# Patient Record
Sex: Male | Born: 1980 | Race: Black or African American | Hispanic: No | Marital: Single | Smoking: Current every day smoker
Health system: Southern US, Community
[De-identification: ages and names within clinical notes are randomized; demographics above are authoritative.]

## PROBLEM LIST (undated history)

## (undated) DIAGNOSIS — J45909 Unspecified asthma, uncomplicated: Secondary | ICD-10-CM

---

## 2010-11-17 LAB — STREP THROAT SCREEN: Strep Screen: POSITIVE

## 2011-07-16 NOTE — ED Notes (Signed)
Patient did not respond to calls to triage by triage nurse.

## 2011-07-17 MED ORDER — CYCLOBENZAPRINE 10 MG TAB
10 mg | ORAL_TABLET | Freq: Three times a day (TID) | ORAL | Status: DC | PRN
Start: 2011-07-17 — End: 2011-09-12

## 2011-07-17 MED ORDER — CYCLOBENZAPRINE 10 MG TAB
10 mg | ORAL | Status: AC
Start: 2011-07-17 — End: 2011-07-17
  Administered 2011-07-17: 08:00:00 via ORAL

## 2011-07-17 MED ORDER — NAPROXEN 250 MG TAB
250 mg | ORAL | Status: AC
Start: 2011-07-17 — End: 2011-07-17
  Administered 2011-07-17: 08:00:00 via ORAL

## 2011-07-17 MED ORDER — HYDROCODONE-ACETAMINOPHEN 5 MG-325 MG TAB
5-325 mg | ORAL_TABLET | ORAL | Status: DC
Start: 2011-07-17 — End: 2011-09-12

## 2011-07-17 MED ORDER — NAPROXEN 500 MG TAB
500 mg | ORAL_TABLET | Freq: Two times a day (BID) | ORAL | Status: AC
Start: 2011-07-17 — End: 2011-07-27

## 2011-07-17 MED ORDER — NAPROXEN 250 MG TAB
250 mg | Freq: Two times a day (BID) | ORAL | Status: DC
Start: 2011-07-17 — End: 2011-07-17

## 2011-07-17 MED ORDER — HYDROMORPHONE (PF) 1 MG/ML IJ SOLN
1 mg/mL | Freq: Once | INTRAMUSCULAR | Status: AC
Start: 2011-07-17 — End: 2011-07-17
  Administered 2011-07-17: 08:00:00 via INTRAMUSCULAR

## 2011-07-17 MED FILL — NAPROXEN 250 MG TAB: 250 mg | ORAL | Qty: 2

## 2011-07-17 MED FILL — HYDROMORPHONE (PF) 1 MG/ML IJ SOLN: 1 mg/mL | INTRAMUSCULAR | Qty: 1

## 2011-07-17 MED FILL — CYCLOBENZAPRINE 10 MG TAB: 10 mg | ORAL | Qty: 1

## 2011-07-17 NOTE — ED Notes (Signed)
I have reviewed discharge instructions with the patient.  The patient verbalized understanding.  Patient armband removed and shredded

## 2011-07-17 NOTE — ED Notes (Signed)
Pt c/o left flank pain and back spasms  X 2 days now radiating towards midline. Pain worsens with twisting.
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
## 2011-09-12 MED ORDER — PREDNISONE 20 MG TAB
20 mg | ORAL | Status: AC
Start: 2011-09-12 — End: 2011-09-12
  Administered 2011-09-12: 14:00:00 via ORAL

## 2011-09-12 MED ORDER — OXYCODONE-ACETAMINOPHEN 5 MG-325 MG TAB
5-325 mg | ORAL_TABLET | ORAL | Status: DC | PRN
Start: 2011-09-12 — End: 2013-03-08

## 2011-09-12 MED ORDER — PREDNISONE 10 MG TABLETS IN A DOSE PACK
10 mg | ORAL_TABLET | ORAL | Status: DC
Start: 2011-09-12 — End: 2013-03-08

## 2011-09-12 MED FILL — PREDNISONE 20 MG TAB: 20 mg | ORAL | Qty: 3
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
## 2011-09-12 NOTE — ED Notes (Signed)
Chase Abbott is a 31 y.o. male that was discharged in good condition.  The patients diagnosis, condition and treatment were explained to  patient and aftercare instructions were given.  The patient verbalized understanding. Patient armband removed and shredded.

## 2011-09-12 NOTE — ED Notes (Signed)
C/O low back pain that radiates down buttocks to bilateral legs x 3 weeks. Pt states that he was seen here last month for same.
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
## 2013-03-08 MED ADMIN — diph,Pertuss(AC),Tet Vac-PF (BOOSTRIX) suspension 0.5 mL: INTRAMUSCULAR | @ 20:00:00 | NDC 58160084201

## 2013-03-08 MED ADMIN — HYDROcodone-acetaminophen (NORCO) 5-325 mg per tablet 1 tablet: ORAL | @ 20:00:00 | NDC 51079077701

## 2013-03-08 MED ADMIN — cyclobenzaprine (FLEXERIL) tablet 10 mg: ORAL | @ 20:00:00 | NDC 68084039711

## 2013-03-08 MED ADMIN — ibuprofen (MOTRIN) tablet 800 mg: ORAL | @ 20:00:00 | NDC 00904585361
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
## 2013-03-08 NOTE — ED Provider Notes (Signed)
I was personally available for consultation in the emergency department.  I have reviewed the chart prior to the patient's discharge and agree with the documentation recorded by the MLP, including the assessment, treatment plan, and disposition.  Saige Canton, MD

## 2014-08-10 ENCOUNTER — Emergency Department: Admit: 2014-08-10 | Payer: Self-pay

## 2014-08-10 ENCOUNTER — Inpatient Hospital Stay
Admit: 2014-08-10 | Discharge: 2014-08-10 | Disposition: A | Discharge status: Home / Self Care | Payer: Self-pay | Attending: Emergency Medicine

## 2014-08-10 DIAGNOSIS — J45901 Unspecified asthma with (acute) exacerbation: Secondary | ICD-10-CM

## 2014-08-10 MED ORDER — ALUM-MAG HYDROXIDE-SIMETH 200 MG-200 MG-20 MG/5 ML ORAL SUSP
200-200-20 mg/5 mL | ORAL | Status: DC | PRN
Start: 2014-08-10 — End: 2016-07-14

## 2014-08-10 MED ORDER — IPRATROPIUM-ALBUTEROL 2.5 MG-0.5 MG/3 ML NEB SOLUTION
2.5 mg-0.5 mg/3 ml | RESPIRATORY_TRACT | Status: AC
Start: 2014-08-10 — End: 2014-08-10
  Administered 2014-08-10: 22:00:00 via RESPIRATORY_TRACT

## 2014-08-10 MED ORDER — ALBUTEROL SULFATE HFA 90 MCG/ACTUATION AEROSOL INHALER
90 mcg/actuation | RESPIRATORY_TRACT | Status: AC | PRN
Start: 2014-08-10 — End: ?

## 2014-08-10 MED ORDER — METHYLPREDNISOLONE 4 MG TABS IN A DOSE PACK
4 mg | ORAL | Status: DC
Start: 2014-08-10 — End: 2016-07-14

## 2014-08-10 MED ORDER — OMEPRAZOLE 10 MG CAP, DELAYED RELEASE
10 mg | ORAL_CAPSULE | Freq: Every day | ORAL | Status: AC
Start: 2014-08-10 — End: 2014-08-30

## 2014-08-10 MED ORDER — DEXAMETHASONE SODIUM PHOSPHATE 4 MG/ML IJ SOLN
4 mg/mL | INTRAMUSCULAR | Status: AC
Start: 2014-08-10 — End: 2014-08-10
  Administered 2014-08-10: 22:00:00 via ORAL

## 2014-08-10 MED ORDER — ALBUTEROL SULFATE 0.083 % (0.83 MG/ML) SOLN FOR INHALATION
2.5 mg /3 mL (0.083 %) | RESPIRATORY_TRACT | Status: DC
Start: 2014-08-10 — End: 2014-08-10

## 2014-08-10 MED ORDER — ALUMINUM-MAGNESIUM HYDROXIDE 200 MG-200 MG/5 ML ORAL SUSP
200-200 mg/5 mL | Freq: Once | ORAL | Status: AC
Start: 2014-08-10 — End: 2014-08-10
  Administered 2014-08-10: 23:00:00 via ORAL

## 2014-08-10 MED FILL — DEXAMETHASONE SODIUM PHOSPHATE 4 MG/ML IJ SOLN: 4 mg/mL | INTRAMUSCULAR | Qty: 3

## 2014-08-10 MED FILL — ALBUTEROL SULFATE 0.083 % (0.83 MG/ML) SOLN FOR INHALATION: 2.5 mg /3 mL (0.083 %) | RESPIRATORY_TRACT | Qty: 1

## 2014-08-10 MED FILL — PHENOBARB-HYOSCYAMN-ATROPINE-SCOP 16.2 MG-0.1037 MG/5 ML (5 ML) ELIXIR: 16.2 mg-0.1037 mg/5 mL (5 mL) | ORAL | Qty: 10

## 2014-08-10 MED FILL — IPRATROPIUM-ALBUTEROL 2.5 MG-0.5 MG/3 ML NEB SOLUTION: 2.5 mg-0.5 mg/3 ml | RESPIRATORY_TRACT | Qty: 3

## 2014-08-10 NOTE — ED Notes (Signed)
Pt reports having exacerbation of his asthma.  States ran out of MDI and used HHN treatment yesterday.  Pt also reports "burning when I eat or drink" since Friday.  Pt in NAD.
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
## 2014-08-10 NOTE — ED Notes (Signed)
I have reviewed discharge instructions with the patient. Prescriptions x 4 were reviewed with patient instructed not to drink alcohol, drive a car, or operate heavy machinery while taking this medicine. The patient verbalized understanding. Patient seen leaving ED ambulatory without difficulty or need for assistance, with s/o in no sign of distress. Patient armband removed and shredded

## 2014-08-11 LAB — EKG, 12 LEAD, INITIAL
Atrial Rate: 92 {beats}/min
Calculated P Axis: 49 degrees
Calculated R Axis: 39 degrees
Calculated T Axis: 12 degrees
Diagnosis: NORMAL
P-R Interval: 152 ms
Q-T Interval: 336 ms
QRS Duration: 86 ms
QTC Calculation (Bezet): 415 ms
Ventricular Rate: 92 {beats}/min

## 2016-07-14 ENCOUNTER — Inpatient Hospital Stay
Admit: 2016-07-14 | Discharge: 2016-07-14 | Disposition: A | Discharge status: Home / Self Care | Payer: Self-pay | Attending: Emergency Medicine

## 2016-07-14 ENCOUNTER — Emergency Department: Admit: 2016-07-14 | Payer: Self-pay

## 2016-07-14 DIAGNOSIS — S0003XA Contusion of scalp, initial encounter: Secondary | ICD-10-CM

## 2016-07-14 MED ORDER — NAPROXEN 375 MG TAB
375 mg | ORAL_TABLET | Freq: Two times a day (BID) | ORAL | 0 refills | Status: AC
Start: 2016-07-14 — End: ?

## 2016-07-14 NOTE — ED Notes (Signed)
I have reviewed discharge instructions with the patient.  The patient verbalized understanding.  Current Discharge Medication List      START taking these medications    Details   naproxen (NAPROSYN) 375 mg tablet Take 1 Tab by mouth two (2) times daily (with meals).  Qty: 20 Tab, Refills: 0         Patient armband removed and shredded
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
## 2017-03-21 ENCOUNTER — Encounter (HOSPITAL_COMMUNITY): Payer: Self-pay

## 2017-03-21 ENCOUNTER — Emergency Department (HOSPITAL_COMMUNITY)
Admission: EM | Admit: 2017-03-21 | Discharge: 2017-03-21 | Disposition: A | Discharge status: Home / Self Care | Payer: Self-pay | Attending: Emergency Medicine | Admitting: Emergency Medicine

## 2017-03-21 DIAGNOSIS — Y999 Unspecified external cause status: Secondary | ICD-10-CM | POA: Insufficient documentation

## 2017-03-21 DIAGNOSIS — W268XXA Contact with other sharp object(s), not elsewhere classified, initial encounter: Secondary | ICD-10-CM | POA: Insufficient documentation

## 2017-03-21 DIAGNOSIS — S61214A Laceration without foreign body of right ring finger without damage to nail, initial encounter: Secondary | ICD-10-CM | POA: Insufficient documentation

## 2017-03-21 DIAGNOSIS — S61216A Laceration without foreign body of right little finger without damage to nail, initial encounter: Secondary | ICD-10-CM | POA: Insufficient documentation

## 2017-03-21 DIAGNOSIS — Y939 Activity, unspecified: Secondary | ICD-10-CM | POA: Insufficient documentation

## 2017-03-21 DIAGNOSIS — Y92000 Kitchen of unspecified non-institutional (private) residence as  the place of occurrence of the external cause: Secondary | ICD-10-CM | POA: Insufficient documentation

## 2017-03-21 DIAGNOSIS — J45909 Unspecified asthma, uncomplicated: Secondary | ICD-10-CM | POA: Insufficient documentation

## 2017-03-21 DIAGNOSIS — F17200 Nicotine dependence, unspecified, uncomplicated: Secondary | ICD-10-CM | POA: Insufficient documentation

## 2017-03-21 HISTORY — DX: Unspecified asthma, uncomplicated: J45.909

## 2017-03-21 MED ORDER — TETANUS-DIPHTH-ACELL PERTUSSIS 5-2.5-18.5 LF-MCG/0.5 IM SUSP: 0.5000 mL | Freq: Once | INTRAMUSCULAR | Status: DC

## 2017-03-21 NOTE — ED Provider Notes (Signed)
   [facility]EM[facility] DEPT Provider Note: [name], MD, FACEP  CSN: [id number] MRN: [id number] ARRIVAL: 03/21/17 at 0440 ROOM: Triage   CHIEF COMPLAINT  Extremity Laceration   HISTORY OF PRESENT ILLNESS  03/21/17 4:53 AM Stephen Sutton is a 36 y.o. male who cut the dorsal PIP joints of the fourth and fifth fingers of his right hand on the bezel of his stove yesterday evening about 9 PM while cooking.  He applied bandages at home with a continued oozing blood so he came to the ED this morning.  They are now hemostatic.  There is minimal associated pain.  There is no functional deficit or sensory deficit.  He is not sure of his tetanus status.   Past Medical History:  Diagnosis Date  . Asthma     History reviewed. No pertinent surgical history.  History reviewed. No pertinent family history.  Social History   Tobacco Use  . Smoking status: Current Every Day Smoker  . Smokeless tobacco: Never Used  Substance Use Topics  . Alcohol use: Yes  . Drug use: No    Prior to Admission medications   Not on File    Allergies Patient has no known allergies.   REVIEW OF SYSTEMS  Negative except as noted here or in the History of Present Illness.   PHYSICAL EXAMINATION  Initial Vital Signs Blood pressure 121/74, pulse (!) 102, temperature 98.3 F (36.8 C), temperature source Oral, resp. rate 18, height 6\' 2"  (1.88 m), weight 115.7 kg (255 lb), SpO2 96 %.  Examination General: Well-developed, well-nourished male in no acute distress; appearance consistent with age of record HENT: normocephalic; atraumatic Eyes: Normal appearance Neck: supple Heart: regular rate and rhythm Lungs: clear to auscultation bilaterally Abdomen: soft; nondistended present Extremities: No deformity; full range of motion Neurologic: Awake, alert; motor function intact in all extremities and symmetric; no facial droop Skin: Warm and dry; superficial lacerations over the right dorsal fourth and fifth  PIP joints, not full-thickness Psychiatric: Normal mood and affect   RESULTS  Summary of this visit's results, reviewed by myself:   EKG Interpretation  Date/Time:    Ventricular Rate:    PR Interval:    QRS Duration:   QT Interval:    QTC Calculation:   R Axis:     Text Interpretation:        Laboratory Studies: No results found for this or any previous visit (from the past 24 hour(s)). Imaging Studies: No results found.  ED COURSE  Nursing notes and initial vitals signs, including pulse oximetry, reviewed.  Vitals:   03/21/17 0449 03/21/17 0452  BP: 121/74   Pulse: (!) 102   Resp: 18   Temp: 98.3 F (36.8 C)   TempSrc: Oral   SpO2: 96%   Weight:  115.7 kg (255 lb)  Height:  6\' 2"  (1.88 m)   Primary closure of wounds not indicated due to their shallow depth as well as extended time since the injury.  We will apply local wound care.  PROCEDURES    ED DIAGNOSES     ICD-10-CM   1. Laceration of right ring finger without foreign body without damage to nail, initial encounter S61.214A   2. Laceration of right little finger without foreign body without damage to nail, initial encounter [id number]        Paula LibraMolpus, Ardyn Forge, MD 03/21/17 [phone]
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
## 2017-03-21 NOTE — ED Notes (Signed)
Bed: [id number] Expected date:  Expected time:  Means of arrival:  Comments: Triage [place]

## 2017-06-04 ENCOUNTER — Ambulatory Visit: Payer: Self-pay | Admitting: Family Medicine

## 2020-03-10 ENCOUNTER — Emergency Department (HOSPITAL_COMMUNITY): Payer: 59

## 2020-03-10 ENCOUNTER — Other Ambulatory Visit: Payer: Self-pay

## 2020-03-10 ENCOUNTER — Encounter (HOSPITAL_COMMUNITY): Payer: Self-pay

## 2020-03-10 ENCOUNTER — Emergency Department (HOSPITAL_COMMUNITY)
Admission: EM | Admit: 2020-03-10 | Discharge: 2020-03-11 | Disposition: A | Discharge status: Home / Self Care | Payer: 59 | Attending: Emergency Medicine | Admitting: Emergency Medicine

## 2020-03-10 DIAGNOSIS — J45909 Unspecified asthma, uncomplicated: Secondary | ICD-10-CM | POA: Insufficient documentation

## 2020-03-10 DIAGNOSIS — Z5321 Procedure and treatment not carried out due to patient leaving prior to being seen by health care provider: Secondary | ICD-10-CM | POA: Insufficient documentation

## 2020-03-10 DIAGNOSIS — H5789 Other specified disorders of eye and adnexa: Secondary | ICD-10-CM | POA: Diagnosis not present
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
## 2020-03-11 NOTE — ED Notes (Signed)
Pt eloped from waiting area. Called 3X.  

## 2020-03-11 NOTE — ED Notes (Signed)
Pt called no answer 

## 2020-03-15 ENCOUNTER — Emergency Department (HOSPITAL_BASED_OUTPATIENT_CLINIC_OR_DEPARTMENT_OTHER)
Admission: EM | Admit: 2020-03-15 | Discharge: 2020-03-16 | Disposition: A | Discharge status: Home / Self Care | Payer: 59 | Attending: Emergency Medicine | Admitting: Emergency Medicine

## 2020-03-15 ENCOUNTER — Emergency Department (HOSPITAL_BASED_OUTPATIENT_CLINIC_OR_DEPARTMENT_OTHER): Payer: 59

## 2020-03-15 ENCOUNTER — Encounter (HOSPITAL_BASED_OUTPATIENT_CLINIC_OR_DEPARTMENT_OTHER): Payer: Self-pay | Admitting: *Deleted

## 2020-03-15 ENCOUNTER — Other Ambulatory Visit: Payer: Self-pay

## 2020-03-15 DIAGNOSIS — J4521 Mild intermittent asthma with (acute) exacerbation: Secondary | ICD-10-CM | POA: Insufficient documentation

## 2020-03-15 DIAGNOSIS — J069 Acute upper respiratory infection, unspecified: Secondary | ICD-10-CM | POA: Diagnosis not present

## 2020-03-15 DIAGNOSIS — Z20822 Contact with and (suspected) exposure to covid-19: Secondary | ICD-10-CM | POA: Diagnosis not present

## 2020-03-15 DIAGNOSIS — R059 Cough, unspecified: Secondary | ICD-10-CM | POA: Diagnosis present

## 2020-03-15 DIAGNOSIS — F17200 Nicotine dependence, unspecified, uncomplicated: Secondary | ICD-10-CM | POA: Diagnosis not present

## 2020-03-15 LAB — RESPIRATORY PANEL BY RT PCR (FLU A&B, COVID)
Influenza A by PCR: NEGATIVE
Influenza B by PCR: NEGATIVE
SARS Coronavirus 2 by RT PCR: NEGATIVE

## 2020-03-15 MED ORDER — IPRATROPIUM BROMIDE HFA 17 MCG/ACT IN AERS
4.0000 | INHALATION_SPRAY | Freq: Once | RESPIRATORY_TRACT | Status: AC
  Administered 2020-03-15: 4 via RESPIRATORY_TRACT
  Filled 2020-03-15: qty 12.9

## 2020-03-15 MED ORDER — ALBUTEROL SULFATE HFA 108 (90 BASE) MCG/ACT IN AERS
8.0000 | INHALATION_SPRAY | RESPIRATORY_TRACT | Status: DC | PRN
  Administered 2020-03-15: 8 via RESPIRATORY_TRACT
  Filled 2020-03-15: qty 6.7

## 2020-03-15 MED ORDER — ALBUTEROL (5 MG/ML) CONTINUOUS INHALATION SOLN
10.0000 mg/h | INHALATION_SOLUTION | RESPIRATORY_TRACT | Status: AC
  Administered 2020-03-15: 10 mg/h via RESPIRATORY_TRACT
  Filled 2020-03-15: qty 20

## 2020-03-15 MED ORDER — PREDNISONE 10 MG PO TABS
60.0000 mg | ORAL_TABLET | Freq: Once | ORAL | Status: AC
  Administered 2020-03-15: 60 mg via ORAL
  Filled 2020-03-15: qty 1

## 2020-03-15 NOTE — ED Provider Notes (Signed)
[facility] EMERGENCY DEPARTMENT Provider Note   CSN: [id number] Arrival date & time: 03/15/20  2217     History Chief Complaint  Patient presents with  . Asthma    Stephen Sutton is a 39 y.o. male.  Patient reports a previous history of asthma.  He is here in the emergency department tonight because he has had 5 days of cough, nasal congestion, sore throat with shortness of breath.  He has tried over-the-counter medications but does not currently have an inhaler or nebulizer.  Tonight his breathing worsened.        Past Medical History:  Diagnosis Date  . Asthma     There are no problems to display for this patient.   History reviewed. No pertinent surgical history.     No family history on file.  Social History   Tobacco Use  . Smoking status: Current Every Day Smoker  . Smokeless tobacco: Never Used  Substance Use Topics  . Alcohol use: Yes  . Drug use: No    Home Medications Prior to Admission medications   Medication Sig Start Date End Date Taking? Authorizing Provider  albuterol (VENTOLIN HFA) 108 (90 Base) MCG/ACT inhaler Inhale 2 puffs into the lungs every 4 (four) hours as needed for wheezing or shortness of breath. 03/16/20   Gilda Crease, MD  benzonatate (TESSALON) 200 MG capsule Take 1 capsule (200 mg total) by mouth 3 (three) times daily as needed for cough. 03/16/20   Gilda Crease, MD  Dextromethorphan-guaiFENesin (MUCINEX DM MAXIMUM STRENGTH) 60-1200 MG TB12 Take 1 tablet by mouth in the morning and at bedtime. 03/16/20   Gilda Crease, MD  predniSONE (DELTASONE) 20 MG tablet Take 2 tablets (40 mg total) by mouth daily with breakfast. 03/16/20   Findley Vi, Canary Brim, MD    Allergies    Patient has no known allergies.  Review of Systems   Review of Systems  HENT: Positive for congestion and sore throat.   Respiratory: Positive for cough, shortness of breath and wheezing.   All other systems reviewed  and are negative.   Physical Exam Updated Vital Signs BP 135/80   Pulse 99   Temp 98.1 F (36.7 C) (Oral)   Resp (!) 22   Ht 6\' 2"  (1.88 m)   Wt 118.4 kg   SpO2 100%   BMI 33.51 kg/m   Physical Exam Vitals and nursing note reviewed.  Constitutional:      General: He is not in acute distress.    Appearance: Normal appearance. He is well-developed.  HENT:     Head: Normocephalic and atraumatic.     Right Ear: Hearing normal.     Left Ear: Hearing normal.     Nose: Nose normal.  Eyes:     Conjunctiva/sclera: Conjunctivae normal.     Pupils: Pupils are equal, round, and reactive to light.  Cardiovascular:     Rate and Rhythm: Regular rhythm.     Heart sounds: S1 normal and S2 normal. No murmur heard.  No friction rub. No gallop.   Pulmonary:     Effort: Pulmonary effort is normal. Tachypnea present. No respiratory distress.     Breath sounds: Normal breath sounds.  Chest:     Chest wall: No tenderness.  Abdominal:     General: Bowel sounds are normal.     Palpations: Abdomen is soft.     Tenderness: There is no abdominal tenderness. There is no guarding or rebound. Negative signs include Murphy's  sign and McBurney's sign.     Hernia: No hernia is present.  Musculoskeletal:        General: Normal range of motion.     Cervical back: Normal range of motion and neck supple.  Skin:    General: Skin is warm and dry.     Findings: No rash.  Neurological:     Mental Status: He is alert and oriented to person, place, and time.     GCS: GCS eye subscore is 4. GCS verbal subscore is 5. GCS motor subscore is 6.     Cranial Nerves: No cranial nerve deficit.     Sensory: No sensory deficit.     Coordination: Coordination normal.  Psychiatric:        Mood and Affect: Mood is anxious.        Speech: Speech normal.        Behavior: Behavior normal.        Thought Content: Thought content normal.     ED Results / Procedures / Treatments   Labs (all labs ordered are listed,  but only abnormal results are displayed) Labs Reviewed  RESPIRATORY PANEL BY RT PCR (FLU A&B, COVID)    EKG None  Radiology DG Chest Port 1 View  Result Date: 03/15/2020 CLINICAL DATA:  Dyspnea EXAM: PORTABLE CHEST 1 VIEW COMPARISON:  03/10/2020 FINDINGS: The heart size and mediastinal contours are within normal limits. Both lungs are clear. The visualized skeletal structures are unremarkable. IMPRESSION: No active disease. Electronically Signed   By: Helyn Numbers MD   On: 03/15/2020 23:15    Procedures Procedures (including critical care time)  Medications Ordered in ED Medications  albuterol (VENTOLIN HFA) 108 (90 Base) MCG/ACT inhaler 8 puff (8 puffs Inhalation Given 03/15/20 2302)  albuterol (PROVENTIL,VENTOLIN) solution continuous neb (0 mg/hr Nebulization Stopped 03/16/20 0045)  ipratropium (ATROVENT HFA) inhaler 4 puff (4 puffs Inhalation Given 03/15/20 2302)  predniSONE (DELTASONE) tablet 60 mg (60 mg Oral Given 03/15/20 2259)    ED Course  I have reviewed the triage vital signs and the nursing notes.  Pertinent labs & imaging results that were available during my care of the patient were reviewed by me and considered in my medical decision making (see chart for details).    MDM Rules/Calculators/A&P                          Patient presents to the emergency department for evaluation of shortness of breath and cough in the setting of history of asthma.  He reports that he has been having a lot of chest congestion.  He is coughing quite a bit during the exam but he has good air movement, normal oxygen saturations.  No significant wheezing at arrival.  He was treated with bronchodilator therapy, continues to have normal vital signs without any significant wheezing.  Chest x-ray shows no evidence of pneumonia or other pathology.  Flu and Covid test is negative.  Final Clinical Impression(s) / ED Diagnoses Final diagnoses:  Mild intermittent asthma with exacerbation    Upper respiratory tract infection, unspecified type    Rx / DC Orders ED Discharge Orders         Ordered    albuterol (VENTOLIN HFA) 108 (90 Base) MCG/ACT inhaler  Every 4 hours PRN        03/16/20 0046    benzonatate (TESSALON) 200 MG capsule  3 times daily PRN        03/16/20  0046    Dextromethorphan-guaiFENesin (MUCINEX DM MAXIMUM STRENGTH) 60-1200 MG TB12  2 times daily        03/16/20 0046    predniSONE (DELTASONE) 20 MG tablet  Daily with breakfast        03/16/20 0046           Gilda Crease, MD 03/16/20 [phone]

## 2020-03-15 NOTE — ED Triage Notes (Addendum)
Hx of asthma. Sore throat. Cough x 5 days. Runny nose and drainage from his eyes. Able to text with no difficulty.

## 2020-03-16 MED ORDER — GUAIFENESIN 100 MG/5ML PO SOLN
15.0000 mL | ORAL | Status: DC | PRN
  Administered 2020-03-16: 300 mg via ORAL
  Filled 2020-03-16: qty 10

## 2020-03-16 MED ORDER — ALBUTEROL SULFATE HFA 108 (90 BASE) MCG/ACT IN AERS: 2.0000 | INHALATION_SPRAY | RESPIRATORY_TRACT | 3 refills | Status: AC | PRN

## 2020-03-16 MED ORDER — MUCINEX DM MAXIMUM STRENGTH 60-1200 MG PO TB12: 1.0000 | ORAL_TABLET | Freq: Two times a day (BID) | ORAL | 0 refills | Status: AC

## 2020-03-16 MED ORDER — PREDNISONE 20 MG PO TABS: 40.0000 mg | ORAL_TABLET | Freq: Every day | ORAL | 0 refills | Status: AC

## 2020-03-16 MED ORDER — BENZONATATE 100 MG PO CAPS
200.0000 mg | ORAL_CAPSULE | Freq: Once | ORAL | Status: AC
  Administered 2020-03-16: 200 mg via ORAL
  Filled 2020-03-16: qty 2

## 2020-03-16 MED ORDER — BENZONATATE 200 MG PO CAPS: 200.0000 mg | ORAL_CAPSULE | Freq: Three times a day (TID) | ORAL | 0 refills | Status: AC | PRN

## 2020-03-16 NOTE — ED Notes (Signed)
Discharge instructions discussed with patient. Verbalized understanding. Departs ED at this time in stable condition.  

## 2021-09-07 IMAGING — CR DG CHEST 2V
2 series · 2 of 2 positions shown · non-contrast
Comparison: None.

CLINICAL DATA: Cough and short of breath

EXAM:
CHEST - 2 VIEW

[w chest pa]
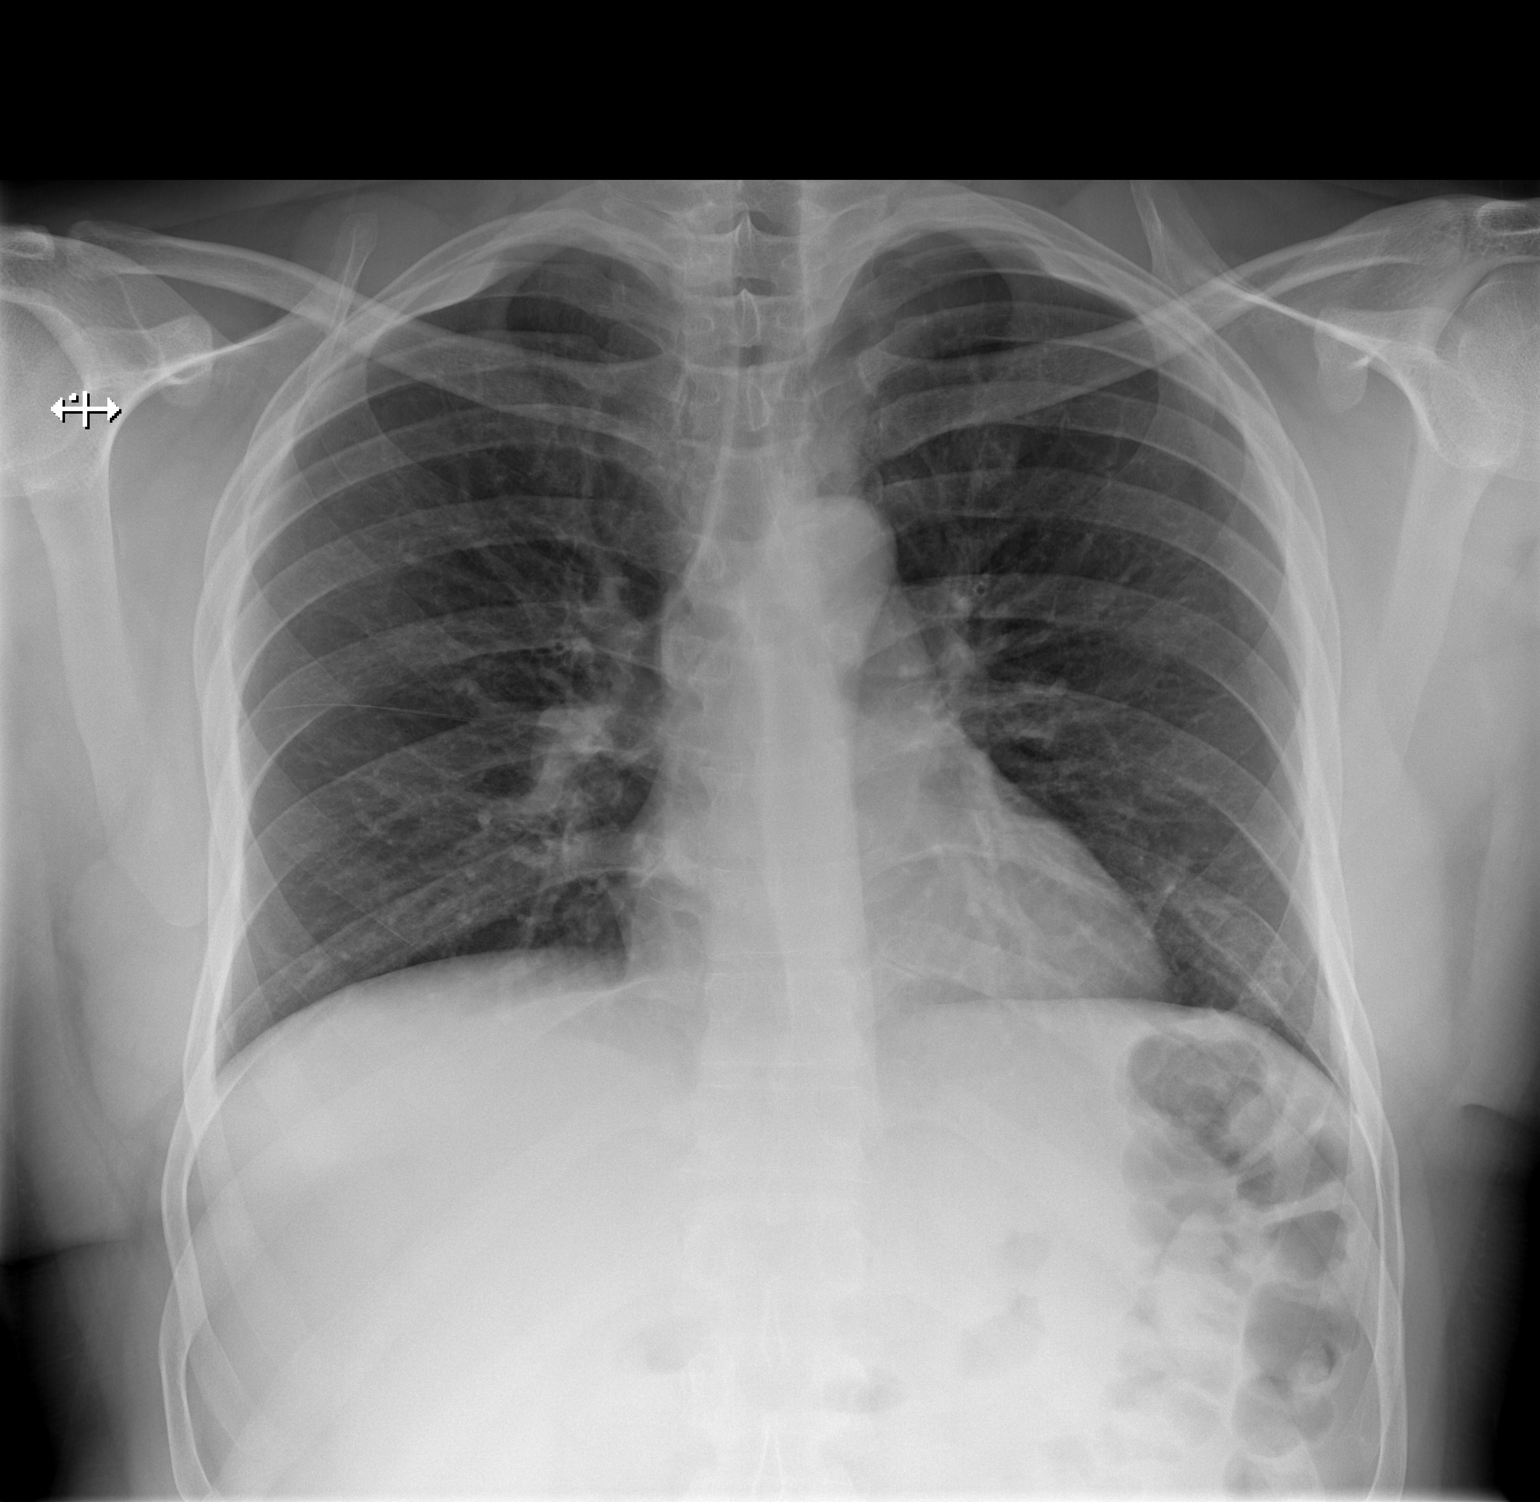

[w chest lat]
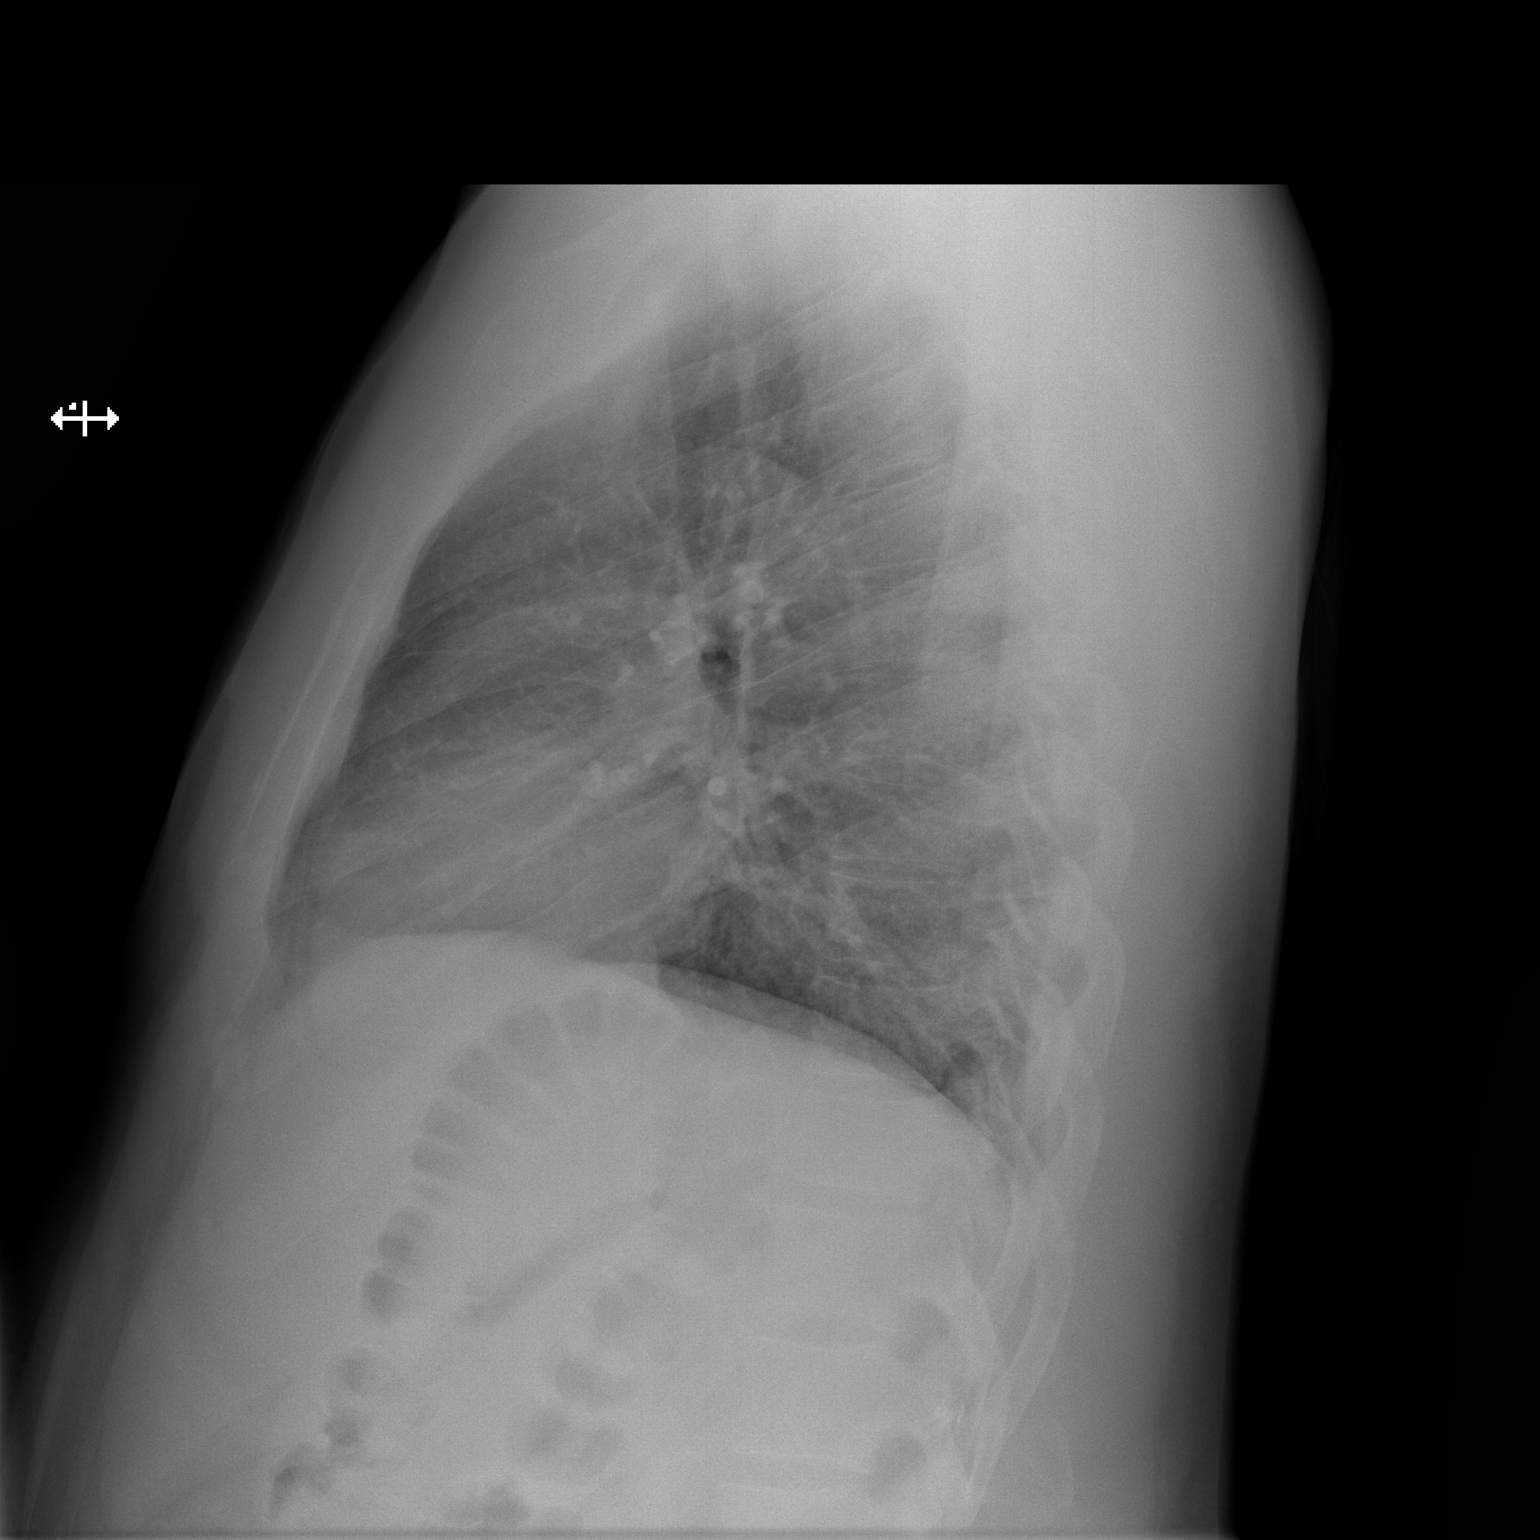

[2 of 2 positions shown; findings below may reference images not displayed]

FINDINGS: The heart size and mediastinal contours are within normal limits.
Both lungs are clear. The visualized skeletal structures are
unremarkable.
IMPRESSION: No active cardiopulmonary disease.
